# Patient Record
Sex: Male | Born: 1960 | Race: White | Hispanic: No | Marital: Married | Smoking: Never smoker
Health system: Southern US, Community
[De-identification: ages and names within clinical notes are randomized; demographics above are authoritative.]

## PROBLEM LIST (undated history)

## (undated) DIAGNOSIS — R001 Bradycardia, unspecified: Secondary | ICD-10-CM

## (undated) DIAGNOSIS — E039 Hypothyroidism, unspecified: Secondary | ICD-10-CM

## (undated) DIAGNOSIS — E785 Hyperlipidemia, unspecified: Secondary | ICD-10-CM

## (undated) HISTORY — PX: CHOLECYSTECTOMY: SHX55

---

## 1998-07-12 ENCOUNTER — Ambulatory Visit (HOSPITAL_BASED_OUTPATIENT_CLINIC_OR_DEPARTMENT_OTHER): Admission: RE | Admit: 1998-07-12 | Discharge: 1998-07-12 | Payer: Self-pay | Admitting: Orthopedic Surgery

## 2001-07-19 ENCOUNTER — Encounter: Payer: Self-pay | Admitting: Emergency Medicine

## 2001-07-19 ENCOUNTER — Emergency Department (HOSPITAL_COMMUNITY): Admission: EM | Admit: 2001-07-19 | Discharge: 2001-07-19 | Payer: Self-pay | Admitting: Emergency Medicine

## 2001-07-29 ENCOUNTER — Encounter: Payer: Self-pay | Admitting: *Deleted

## 2001-07-29 ENCOUNTER — Observation Stay (HOSPITAL_COMMUNITY): Admission: RE | Admit: 2001-07-29 | Discharge: 2001-07-30 | Payer: Self-pay | Admitting: *Deleted

## 2002-08-16 ENCOUNTER — Encounter: Admission: RE | Admit: 2002-08-16 | Discharge: 2002-08-16 | Payer: Self-pay | Admitting: *Deleted

## 2002-08-16 ENCOUNTER — Encounter: Payer: Self-pay | Admitting: *Deleted

## 2004-06-05 ENCOUNTER — Encounter: Admission: RE | Admit: 2004-06-05 | Discharge: 2004-06-05 | Payer: Self-pay | Admitting: General Surgery

## 2006-09-02 ENCOUNTER — Encounter: Admission: RE | Admit: 2006-09-02 | Discharge: 2006-09-02 | Payer: Self-pay | Admitting: Internal Medicine

## 2015-04-05 ENCOUNTER — Other Ambulatory Visit: Payer: Self-pay | Admitting: Gastroenterology

## 2015-06-12 ENCOUNTER — Encounter (HOSPITAL_COMMUNITY): Payer: Self-pay | Admitting: *Deleted

## 2015-06-12 NOTE — Anesthesia Preprocedure Evaluation (Addendum)
Anesthesia Evaluation  Patient identified by MRN, date of birth, ID band Patient awake    Reviewed: Allergy & Precautions, NPO status , Patient's Chart, lab work & pertinent test results  Airway Mallampati: III   Neck ROM: Full    Dental  (+) Teeth Intact, Dental Advisory Given   Pulmonary neg pulmonary ROS,    breath sounds clear to auscultation       Cardiovascular negative cardio ROS   Rhythm:Regular     Neuro/Psych negative neurological ROS  negative psych ROS   GI/Hepatic Neg liver ROS, GERD  Medicated,  Endo/Other  negative endocrine ROSHypothyroidism   Renal/GU negative Renal ROS  negative genitourinary   Musculoskeletal negative musculoskeletal ROS (+)   Abdominal (+)  Abdomen: soft.    Peds negative pediatric ROS (+)  Hematology negative hematology ROS (+)   Anesthesia Other Findings   Reproductive/Obstetrics negative OB ROS                            Anesthesia Physical Anesthesia Plan  ASA: II  Anesthesia Plan: MAC   Post-op Pain Management:    Induction: Intravenous  Airway Management Planned: Nasal Cannula  Additional Equipment:   Intra-op Plan:   Post-operative Plan:   Informed Consent: I have reviewed the patients History and Physical, chart, labs and discussed the procedure including the risks, benefits and alternatives for the proposed anesthesia with the patient or authorized representative who has indicated his/her understanding and acceptance.     Plan Discussed with:   Anesthesia Plan Comments:         Anesthesia Quick Evaluation

## 2015-06-19 ENCOUNTER — Ambulatory Visit (HOSPITAL_COMMUNITY): Payer: BLUE CROSS/BLUE SHIELD | Admitting: Anesthesiology

## 2015-06-19 ENCOUNTER — Encounter (HOSPITAL_COMMUNITY)
Admission: RE | Disposition: A | Discharge status: Home / Self Care | Payer: Self-pay | Source: Ambulatory Visit | Attending: Gastroenterology

## 2015-06-19 ENCOUNTER — Encounter (HOSPITAL_COMMUNITY): Payer: Self-pay | Admitting: *Deleted

## 2015-06-19 ENCOUNTER — Ambulatory Visit (HOSPITAL_COMMUNITY)
Admission: RE | Admit: 2015-06-19 | Discharge: 2015-06-19 | Disposition: A | Discharge status: Home / Self Care | Payer: BLUE CROSS/BLUE SHIELD | Source: Ambulatory Visit | Attending: Gastroenterology | Admitting: Gastroenterology

## 2015-06-19 DIAGNOSIS — Z79899 Other long term (current) drug therapy: Secondary | ICD-10-CM | POA: Diagnosis not present

## 2015-06-19 DIAGNOSIS — Z1211 Encounter for screening for malignant neoplasm of colon: Secondary | ICD-10-CM | POA: Diagnosis not present

## 2015-06-19 DIAGNOSIS — Z8711 Personal history of peptic ulcer disease: Secondary | ICD-10-CM | POA: Insufficient documentation

## 2015-06-19 DIAGNOSIS — E039 Hypothyroidism, unspecified: Secondary | ICD-10-CM | POA: Diagnosis not present

## 2015-06-19 DIAGNOSIS — K219 Gastro-esophageal reflux disease without esophagitis: Secondary | ICD-10-CM | POA: Diagnosis not present

## 2015-06-19 DIAGNOSIS — Z9049 Acquired absence of other specified parts of digestive tract: Secondary | ICD-10-CM | POA: Diagnosis not present

## 2015-06-19 HISTORY — PX: COLONOSCOPY WITH PROPOFOL: SHX5780

## 2015-06-19 HISTORY — DX: Hypothyroidism, unspecified: E03.9

## 2015-06-19 HISTORY — DX: Hyperlipidemia, unspecified: E78.5

## 2015-06-19 HISTORY — DX: Bradycardia, unspecified: R00.1

## 2015-06-19 SURGERY — COLONOSCOPY WITH PROPOFOL
Anesthesia: Monitor Anesthesia Care

## 2015-06-19 MED ORDER — SODIUM CHLORIDE 0.9 % IV SOLN
INTRAVENOUS | Status: DC
Start: 2015-06-19 — End: 2015-06-19

## 2015-06-19 MED ORDER — LIDOCAINE HCL (CARDIAC) 20 MG/ML IV SOLN
INTRAVENOUS | Status: AC
  Filled 2015-06-19: qty 5

## 2015-06-19 MED ORDER — PROPOFOL 10 MG/ML IV BOLUS
INTRAVENOUS | Status: AC
  Filled 2015-06-19: qty 40

## 2015-06-19 MED ORDER — MEPERIDINE HCL 100 MG/ML IJ SOLN: 6.2500 mg | INTRAMUSCULAR | Status: DC | PRN

## 2015-06-19 MED ORDER — PROMETHAZINE HCL 25 MG/ML IJ SOLN: 6.2500 mg | INTRAMUSCULAR | Status: DC | PRN

## 2015-06-19 MED ORDER — FENTANYL CITRATE (PF) 100 MCG/2ML IJ SOLN: 25.0000 ug | INTRAMUSCULAR | Status: DC | PRN

## 2015-06-19 MED ORDER — PROPOFOL 10 MG/ML IV BOLUS
INTRAVENOUS | Status: DC | PRN
Start: 2015-06-19 — End: 2015-06-19
  Administered 2015-06-19 (×3): 100 mg via INTRAVENOUS
  Administered 2015-06-19: 50 mg via INTRAVENOUS

## 2015-06-19 MED ORDER — LACTATED RINGERS IV SOLN
INTRAVENOUS | Status: DC
  Administered 2015-06-19: 09:00:00 via INTRAVENOUS

## 2015-06-19 SURGICAL SUPPLY — 21 items

## 2015-06-19 NOTE — Anesthesia Postprocedure Evaluation (Signed)
Anesthesia Post Note  Patient: Keith Chen  Procedure(s) Performed: Procedure(s) (LRB): COLONOSCOPY WITH PROPOFOL (N/A)  Patient location during evaluation: PACU Anesthesia Type: MAC Level of consciousness: awake and alert Pain management: pain level controlled Vital Signs Assessment: post-procedure vital signs reviewed and stable Respiratory status: spontaneous breathing, nonlabored ventilation, respiratory function stable and patient connected to nasal cannula oxygen Cardiovascular status: stable and blood pressure returned to baseline Anesthetic complications: no    Last Vitals:  Filed Vitals:   06/19/15 1000 06/19/15 1010  BP: 133/76 121/78  Pulse: 56 53  Temp:    Resp: 18 15    Last Pain: There were no vitals filed for this visit.               Geneviene Tesch

## 2015-06-19 NOTE — Op Note (Signed)
Procedure: Baseline screening colonoscopy.  Endoscopist: Danise Edge  Premedication: Propofol administered by anesthesia  Procedure: The patient was placed in the left lateral decubitus position. Anal inspection and digital rectal exam were normal. The Pentax pediatric colonoscope was introduced into the rectum and advanced to the cecum. A normal-appearing appendiceal orifice and ileocecal valve were identified. Colonic preparation for the exam today was good. Withdrawal time was 13 minutes  Rectum. Normal. Retroflexed view of the distal rectum was normal  Sigmoid colon and descending colon. Normal  Splenic flexure. Normal  Transverse colon. Normal  Hepatic flexure. Normal  Ascending colon. Normal  Cecum and ileocecal valve. Normal  Assessment: Normal screening colonoscopy  Recommendation: Schedule repeat screening colonoscopy in 10 years

## 2015-06-19 NOTE — Discharge Instructions (Signed)

## 2015-06-19 NOTE — Transfer of Care (Signed)
Immediate Anesthesia Transfer of Care Note  Patient: Keith Chen  Procedure(s) Performed: Procedure(s): COLONOSCOPY WITH PROPOFOL (N/A)  Patient Location: PACU  Anesthesia Type:MAC  Level of Consciousness:  sedated, patient cooperative and responds to stimulation  Airway & Oxygen Therapy:Patient Spontanous Breathing   Post-op Assessment:  Report given to PACU RN and Post -op Vital signs reviewed and stable  Post vital signs:  Reviewed and stable  Last Vitals:  Filed Vitals:   06/19/15 0826  BP: 136/78  Pulse: 60  Temp: 36.5 C  Resp: 15    Complications: No apparent anesthesia complications

## 2015-06-19 NOTE — H&P (Signed)
  Procedure: Baseline screening colonoscopy  History: The patient is a 55 year old male born 1961/05/13. He is scheduled to undergo his first screening colonoscopy with polypectomy to prevent colon cancer.  Past medical history: Cholecystectomy. Right elbow surgery. Peptic ulcer disease.  Allergies: Cialis caused headaches. Tramadol caused insomnia.  Family history: Negative for colon cancer.  Exam: The patient is alert and lying comfortably on the endoscopy stretcher. Abdomen is soft and nontender to palpation. Cardiac exam reveals a regular rhythm. Lungs are clear to auscultation.  Plan: Proceed with screening colonoscopy

## 2015-06-20 ENCOUNTER — Encounter (HOSPITAL_COMMUNITY): Payer: Self-pay | Admitting: Gastroenterology

## 2015-08-30 DIAGNOSIS — E039 Hypothyroidism, unspecified: Secondary | ICD-10-CM | POA: Diagnosis not present

## 2015-08-30 DIAGNOSIS — E291 Testicular hypofunction: Secondary | ICD-10-CM | POA: Diagnosis not present

## 2015-08-30 DIAGNOSIS — E559 Vitamin D deficiency, unspecified: Secondary | ICD-10-CM | POA: Diagnosis not present

## 2015-08-30 DIAGNOSIS — Z79899 Other long term (current) drug therapy: Secondary | ICD-10-CM | POA: Diagnosis not present

## 2015-08-30 DIAGNOSIS — N529 Male erectile dysfunction, unspecified: Secondary | ICD-10-CM | POA: Diagnosis not present

## 2015-08-30 DIAGNOSIS — E785 Hyperlipidemia, unspecified: Secondary | ICD-10-CM | POA: Diagnosis not present

## 2015-08-30 DIAGNOSIS — E669 Obesity, unspecified: Secondary | ICD-10-CM | POA: Diagnosis not present

## 2015-08-30 DIAGNOSIS — M25561 Pain in right knee: Secondary | ICD-10-CM | POA: Diagnosis not present

## 2016-02-29 DIAGNOSIS — E291 Testicular hypofunction: Secondary | ICD-10-CM | POA: Diagnosis not present

## 2016-02-29 DIAGNOSIS — E039 Hypothyroidism, unspecified: Secondary | ICD-10-CM | POA: Diagnosis not present

## 2016-02-29 DIAGNOSIS — M7580 Other shoulder lesions, unspecified shoulder: Secondary | ICD-10-CM | POA: Diagnosis not present

## 2016-02-29 DIAGNOSIS — E559 Vitamin D deficiency, unspecified: Secondary | ICD-10-CM | POA: Diagnosis not present

## 2016-02-29 DIAGNOSIS — Z79899 Other long term (current) drug therapy: Secondary | ICD-10-CM | POA: Diagnosis not present

## 2016-02-29 DIAGNOSIS — E785 Hyperlipidemia, unspecified: Secondary | ICD-10-CM | POA: Diagnosis not present

## 2016-02-29 DIAGNOSIS — Z0000 Encounter for general adult medical examination without abnormal findings: Secondary | ICD-10-CM | POA: Diagnosis not present

## 2016-02-29 DIAGNOSIS — N529 Male erectile dysfunction, unspecified: Secondary | ICD-10-CM | POA: Diagnosis not present

## 2016-05-09 DIAGNOSIS — E039 Hypothyroidism, unspecified: Secondary | ICD-10-CM | POA: Diagnosis not present

## 2016-06-04 DIAGNOSIS — H04123 Dry eye syndrome of bilateral lacrimal glands: Secondary | ICD-10-CM | POA: Diagnosis not present

## 2016-06-04 DIAGNOSIS — H40033 Anatomical narrow angle, bilateral: Secondary | ICD-10-CM | POA: Diagnosis not present

## 2016-09-10 DIAGNOSIS — M7580 Other shoulder lesions, unspecified shoulder: Secondary | ICD-10-CM | POA: Diagnosis not present

## 2016-09-10 DIAGNOSIS — E669 Obesity, unspecified: Secondary | ICD-10-CM | POA: Diagnosis not present

## 2016-09-10 DIAGNOSIS — E559 Vitamin D deficiency, unspecified: Secondary | ICD-10-CM | POA: Diagnosis not present

## 2016-09-10 DIAGNOSIS — M25561 Pain in right knee: Secondary | ICD-10-CM | POA: Diagnosis not present

## 2016-09-10 DIAGNOSIS — N529 Male erectile dysfunction, unspecified: Secondary | ICD-10-CM | POA: Diagnosis not present

## 2016-09-10 DIAGNOSIS — E785 Hyperlipidemia, unspecified: Secondary | ICD-10-CM | POA: Diagnosis not present

## 2016-09-10 DIAGNOSIS — E291 Testicular hypofunction: Secondary | ICD-10-CM | POA: Diagnosis not present

## 2016-09-10 DIAGNOSIS — E039 Hypothyroidism, unspecified: Secondary | ICD-10-CM | POA: Diagnosis not present

## 2017-03-10 ENCOUNTER — Other Ambulatory Visit: Payer: Self-pay | Admitting: Internal Medicine

## 2017-03-10 ENCOUNTER — Ambulatory Visit
Admission: RE | Admit: 2017-03-10 | Discharge: 2017-03-10 | Disposition: A | Discharge status: Home / Self Care | Payer: BLUE CROSS/BLUE SHIELD | Source: Ambulatory Visit | Attending: Internal Medicine | Admitting: Internal Medicine

## 2017-03-10 DIAGNOSIS — N529 Male erectile dysfunction, unspecified: Secondary | ICD-10-CM | POA: Diagnosis not present

## 2017-03-10 DIAGNOSIS — E291 Testicular hypofunction: Secondary | ICD-10-CM | POA: Diagnosis not present

## 2017-03-10 DIAGNOSIS — M542 Cervicalgia: Secondary | ICD-10-CM | POA: Diagnosis not present

## 2017-03-10 DIAGNOSIS — M5412 Radiculopathy, cervical region: Secondary | ICD-10-CM

## 2017-03-10 DIAGNOSIS — Z79899 Other long term (current) drug therapy: Secondary | ICD-10-CM | POA: Diagnosis not present

## 2017-03-10 DIAGNOSIS — E559 Vitamin D deficiency, unspecified: Secondary | ICD-10-CM | POA: Diagnosis not present

## 2017-03-10 DIAGNOSIS — Z0000 Encounter for general adult medical examination without abnormal findings: Secondary | ICD-10-CM | POA: Diagnosis not present

## 2017-03-10 DIAGNOSIS — E785 Hyperlipidemia, unspecified: Secondary | ICD-10-CM | POA: Diagnosis not present

## 2017-03-10 DIAGNOSIS — E039 Hypothyroidism, unspecified: Secondary | ICD-10-CM | POA: Diagnosis not present

## 2017-03-31 ENCOUNTER — Ambulatory Visit (INDEPENDENT_AMBULATORY_CARE_PROVIDER_SITE_OTHER): Payer: BLUE CROSS/BLUE SHIELD

## 2017-03-31 ENCOUNTER — Ambulatory Visit (INDEPENDENT_AMBULATORY_CARE_PROVIDER_SITE_OTHER): Payer: BLUE CROSS/BLUE SHIELD | Admitting: Orthopaedic Surgery

## 2017-03-31 ENCOUNTER — Encounter (INDEPENDENT_AMBULATORY_CARE_PROVIDER_SITE_OTHER): Payer: Self-pay | Admitting: Orthopaedic Surgery

## 2017-03-31 DIAGNOSIS — M25521 Pain in right elbow: Secondary | ICD-10-CM

## 2017-03-31 DIAGNOSIS — M25561 Pain in right knee: Secondary | ICD-10-CM

## 2017-03-31 DIAGNOSIS — G8929 Other chronic pain: Secondary | ICD-10-CM | POA: Diagnosis not present

## 2017-03-31 DIAGNOSIS — M542 Cervicalgia: Secondary | ICD-10-CM | POA: Diagnosis not present

## 2017-03-31 DIAGNOSIS — M7541 Impingement syndrome of right shoulder: Secondary | ICD-10-CM

## 2017-03-31 MED ORDER — METHYLPREDNISOLONE ACETATE 40 MG/ML IJ SUSP
40.0000 mg | INTRAMUSCULAR | Status: AC | PRN
  Administered 2017-03-31: 40 mg via INTRA_ARTICULAR

## 2017-03-31 MED ORDER — LIDOCAINE HCL 1 % IJ SOLN
1.0000 mL | INTRAMUSCULAR | Status: AC | PRN
  Administered 2017-03-31: 1 mL

## 2017-03-31 MED ORDER — LIDOCAINE HCL 1 % IJ SOLN
3.0000 mL | INTRAMUSCULAR | Status: AC | PRN
  Administered 2017-03-31: 3 mL

## 2017-03-31 MED ORDER — NABUMETONE 500 MG PO TABS: 500.0000 mg | ORAL_TABLET | Freq: Two times a day (BID) | ORAL | 2 refills | Status: AC | PRN

## 2017-03-31 NOTE — Progress Notes (Signed)
Office Visit Note   Patient: Keith Chen           Date of Birth: November 21, 1960           MRN: [id number] Visit Date: 03/31/2017              Requested by: Marden NobleGates, Robert, MD [address] [place], [place] [address] PCP: Marden NobleGates, Robert, MD   Assessment & Plan: Visit Diagnoses:  1. Neck pain   2. Pain in right elbow   3. Impingement syndrome of right shoulder     Plan: I am going to start him on Relafen as an anti-inflammatory.  He is not a diabetic so offered him injections in his right shoulder his right knee and his right elbow.  He understands the risk and benefits of injections as well.  He has had these in the past but is been a long period of time.  I would also like to set him up for nerve conduction studies by Dr. Alvester MorinNewton for the right upper extremity to assess for cervical radiculopathy versus carpal tunnel syndrome.  He understands this as well.  I will see him back myself in 3 weeks.  When I see him in 3 weeks we do need to get an AP and lateral of his right knee due to some symptoms he let me know about as he was leaving.  Follow-Up Instructions: Return in about 3 weeks (around 04/21/2017).   Orders:  Orders Placed This Encounter  Procedures  . Large Joint Inj  . Medium Joint Inj  . Large Joint Inj  . XR Elbow 2 Views Right  . XR Cervical Spine 2 or 3 views   Meds ordered this encounter  Medications  . nabumetone (RELAFEN) 500 MG tablet    Sig: Take 1 tablet (500 mg total) 2 (two) times daily as needed by mouth.    Dispense:  60 tablet    Refill:  2      Procedures: Large Joint Inj: R knee on 03/31/2017 11:40 AM Indications: pain and joint swelling Medications: 3 mL lidocaine 1 %; 40 mg methylPREDNISolone acetate 40 MG/ML  Medium Joint Inj: R elbow on 03/31/2017 11:40 AM Indications: pain Medications: 1 mL lidocaine 1 %; 40 mg methylPREDNISolone acetate 40 MG/ML  Large Joint Inj: R subacromial bursa on 03/31/2017 11:41 AM Medications: 3 mL  lidocaine 1 %; 40 mg methylPREDNISolone acetate 40 MG/ML      Clinical Data: No additional findings.    Subjective: No chief complaint on file. 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  He comes in with chief complaint of right shoulder pain right elbow pain occasionally hand pain with numbness he gets in his hand.  He denies any significant weakness in his right upper extremity but does report some neck pain on occasion.  He has had a steroid injection in the past and his right elbow and right shoulder that has been remote.  He said that he still helped quite a bit.  He said there is no lightheaded and tunnel in terms of his need to stop performing heavy manual labor because of his job he will have to be doing for years to come.  He is right-hand dominant.  He denies any headache, chest pain, shortness of breath, fever, chills, nausea, vomiting.  HPI  Review of Systems   Objective: Vital Signs: There were no vitals  taken for this visit.  Physical Exam He is alert and oriented x3 and in no acute distress Ortho Exam Examination of his right shoulder shows an intact rotator cuff.  He has pain with internal rotation with adduction.  He has a positive Neer and Hawkins sign.  His right elbow lacks full extension by a few degrees but has full range of motion in terms of flexion he feels ligaments are stable.  He can feel some crepitation of the elbow joint itself.  He has some global tenderness and slight swelling with the elbow.  Examination of his hand shows normal grip strength.  He has some pain in the middle finger.  He has subjective numbness in the median nerve distribution but he said this only occurs though when he is gripping the steering wheel when he drives.  His Phalen's and Tinel's exams are equivocal. Specialty Comments:  No specialty comments available.  Imaging: Xr Cervical Spine 2 Or 3 Views  Result  Date: 03/31/2017 2 views of the cervical spine showed no acute findings and no malalignment.  Is minimal arthritic changes.  Xr Elbow 2 Views Right  Result Date: 03/31/2017 2 views of the right elbow show significant arthritis throughout the elbow joint.  There is no acute changes otherwise.    PMFS History: Patient Active Problem List   Diagnosis Date Noted  . Neck pain 03/31/2017  . Pain in right elbow 03/31/2017  . Impingement syndrome of right shoulder 03/31/2017   Past Medical History:  Diagnosis Date  . Bradycardia    history of   . Hyperlipidemia   . Hypothyroidism     History reviewed. No pertinent family history.  Past Surgical History:  Procedure Laterality Date  . CHOLECYSTECTOMY     laparoscopic '01   Social History   Occupational History  . Not on file  Tobacco Use  . Smoking status: Never Smoker  Substance and Sexual Activity  . Alcohol use: Yes    Comment: rare social  . Drug use: No  . Sexual activity: Not on file

## 2017-04-01 ENCOUNTER — Other Ambulatory Visit (INDEPENDENT_AMBULATORY_CARE_PROVIDER_SITE_OTHER): Payer: Self-pay

## 2017-04-01 DIAGNOSIS — M79601 Pain in right arm: Secondary | ICD-10-CM

## 2017-04-22 ENCOUNTER — Encounter (INDEPENDENT_AMBULATORY_CARE_PROVIDER_SITE_OTHER): Payer: BLUE CROSS/BLUE SHIELD | Admitting: Physical Medicine and Rehabilitation

## 2017-04-28 ENCOUNTER — Ambulatory Visit (INDEPENDENT_AMBULATORY_CARE_PROVIDER_SITE_OTHER): Payer: BLUE CROSS/BLUE SHIELD | Admitting: Orthopaedic Surgery

## 2017-04-28 ENCOUNTER — Ambulatory Visit (INDEPENDENT_AMBULATORY_CARE_PROVIDER_SITE_OTHER): Payer: BLUE CROSS/BLUE SHIELD

## 2017-04-28 ENCOUNTER — Encounter (INDEPENDENT_AMBULATORY_CARE_PROVIDER_SITE_OTHER): Payer: Self-pay | Admitting: Orthopaedic Surgery

## 2017-04-28 DIAGNOSIS — G8929 Other chronic pain: Secondary | ICD-10-CM | POA: Diagnosis not present

## 2017-04-28 DIAGNOSIS — M25561 Pain in right knee: Secondary | ICD-10-CM | POA: Diagnosis not present

## 2017-04-28 NOTE — Progress Notes (Signed)
Consult Note   Patient: Keith Chen             Date of Birth: November 28, 1960           MRN: [id number]             Visit Date: 04/28/2017  Requested by: Marden NobleGates, Robert, MD [address] [place], [place] [address] PCP: Marden NobleGates, Robert, MD  Thank you for asking me to evaluate this patient for No chief complaint on file.  Below are my findings.     Assessment & Plan: Visit Diagnoses:  1. Chronic pain of right knee     Plan: At this point he does not have time for physical therapy which I think would help on his neck.  We will watch his knee for now since the steroid injection in the Relafen does help.  Were also can hold off on nerve conduction studies since his numbness and tingling has improved dramatically on the right upper extremity.  All questions and concerns were answered and addressed.  My next step for his knee would be an MRI or at least repeat injection if he continues to have mechanical symptoms.  He will otherwise follow-up as needed.  Follow Up Instructions: Return if symptoms worsen or fail to improve.  Orders: Orders Placed This Encounter  Procedures  . XR Knee 1-2 Views Right   No orders of the defined types were placed in this encounter.    Procedures: No procedures performed   Clinical Data: No additional findings.   Subjective:  No chief complaint on file. The patient is coming today with mainly a chief complaint of right knee pain but were also following him for right-sided neck pain and right shoulder and elbow pain.  We at least provided 3 different injections a month ago with one being in the right shoulder subacromial space and one in the right elbow.  We also provided 1 in the right knee.  He says the knee mainly hurts with pivoting types of activities.  He sustained an injury to that knee about 5 years ago and he said it swelled up for about 3 or 4 days.  Again he reiterates that the injection has helped.  Right now he is denying any  locking catching.  He says his numbness and tingling going down his right arm is also almost resolved.  We had set him up for nerve conduction studies but he had a missed that appointment.  HPI  Review of Systems He currently denies any headache, chest pain, shortness of breath, fever, chills, nausea, vomiting.  Objective: Vital Signs: There were no vitals taken for this visit.  Physical Exam  Is alert and oriented x3 and in no acute distress Ortho Exam  Examination of his neck does show stiffness in the paraspinal muscles to the right side into the trapezius muscle as well.  Examination of his right shoulder and elbow have improved in terms of mobility.  He is got excellent grip strength on his right side.  His right knee is assessed and found to have a negative McMurray's negative Lockman's exam.  There is minimal tenderness in his knee with good range of motion.  The knee is ligamentously stable. Specialty Comments:  No specialty comments available.  Imaging:  Xr Knee 1-2 Views Right  Result Date: 04/28/2017 2 views of the right knee show no acute findings.  The joint space is still well-maintained and there is minimal patellofemoral arthritic changes.  There is no effusion.    PMFS History: Patient Active Problem List   Diagnosis Date Noted  . Chronic pain of right knee 04/28/2017  . Neck pain 03/31/2017  . Pain in right elbow 03/31/2017  . Impingement syndrome of right shoulder 03/31/2017   Past Medical History:  Diagnosis Date  . Bradycardia    history of   . Hyperlipidemia   . Hypothyroidism     History reviewed. No pertinent family history. Past Surgical History:  Procedure Laterality Date  . CHOLECYSTECTOMY     laparoscopic '01  . COLONOSCOPY WITH PROPOFOL N/A 06/19/2015   Procedure: COLONOSCOPY WITH PROPOFOL;  Surgeon: Charolett BumpersMartin K Johnson, MD;  Location: [facility];  Service: Endoscopy;  Laterality: N/A;   Social History   Occupational History  . Not on file    Tobacco Use  . Smoking status: Never Smoker  Substance and Sexual Activity  . Alcohol use: Yes    Comment: rare social  . Drug use: No  . Sexual activity: Not on file

## 2017-09-09 DIAGNOSIS — N529 Male erectile dysfunction, unspecified: Secondary | ICD-10-CM | POA: Diagnosis not present

## 2017-09-09 DIAGNOSIS — Z79899 Other long term (current) drug therapy: Secondary | ICD-10-CM | POA: Diagnosis not present

## 2017-09-09 DIAGNOSIS — E039 Hypothyroidism, unspecified: Secondary | ICD-10-CM | POA: Diagnosis not present

## 2017-09-09 DIAGNOSIS — E785 Hyperlipidemia, unspecified: Secondary | ICD-10-CM | POA: Diagnosis not present

## 2017-09-09 DIAGNOSIS — E291 Testicular hypofunction: Secondary | ICD-10-CM | POA: Diagnosis not present

## 2018-01-14 DIAGNOSIS — T675XXA Heat exhaustion, unspecified, initial encounter: Secondary | ICD-10-CM | POA: Diagnosis not present

## 2018-01-14 DIAGNOSIS — B009 Herpesviral infection, unspecified: Secondary | ICD-10-CM | POA: Diagnosis not present

## 2018-03-04 IMAGING — CR DG CERVICAL SPINE 2 OR 3 VIEWS
5 series · 5 of 5 positions shown · non-contrast
Comparison: None.

CLINICAL DATA: Right-sided neck pain with radiation for 2 or 3
years. Increasing pain and numbness. No acute injury or prior
relevant surgery.

EXAM:
CERVICAL SPINE - 2-3 VIEW

[w c-spine lat * (1 of 2)]
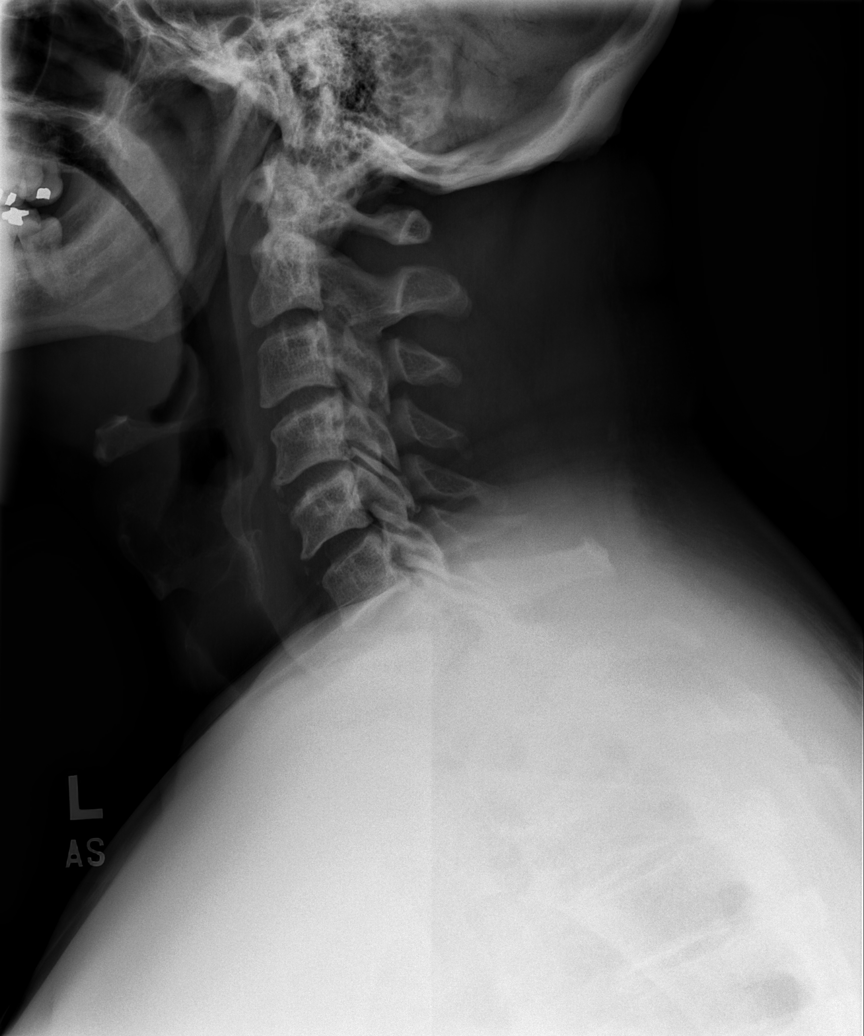

[w swimmers view *]
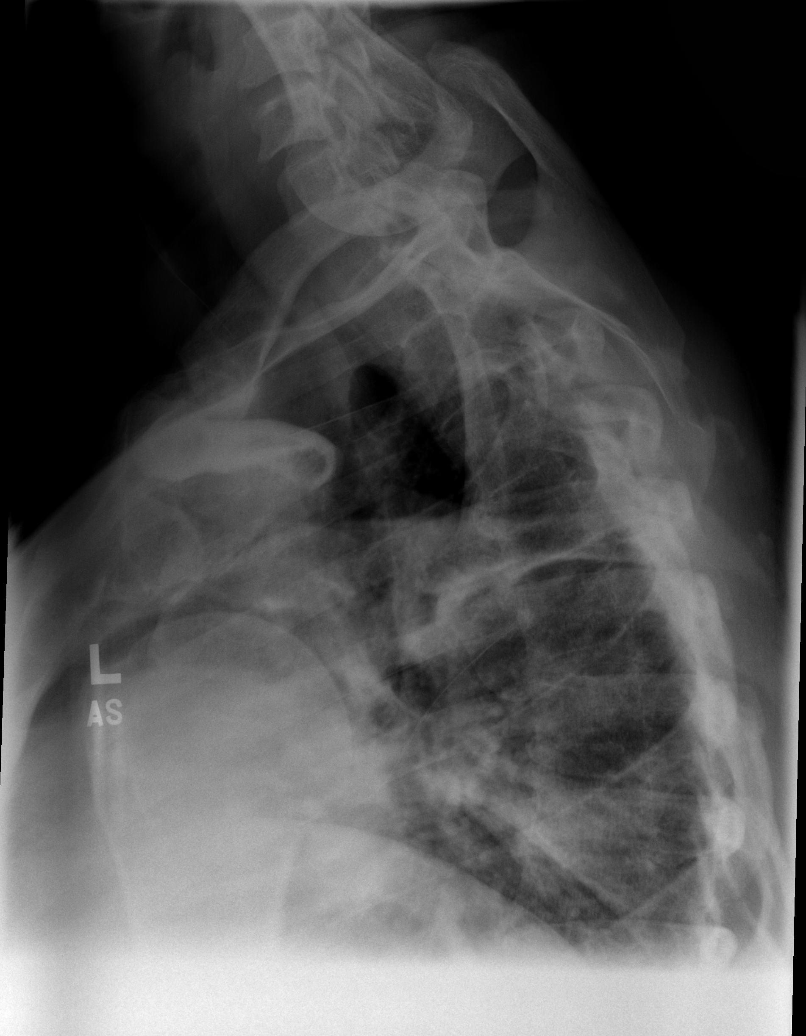

[w c-spine lat * (2 of 2)]
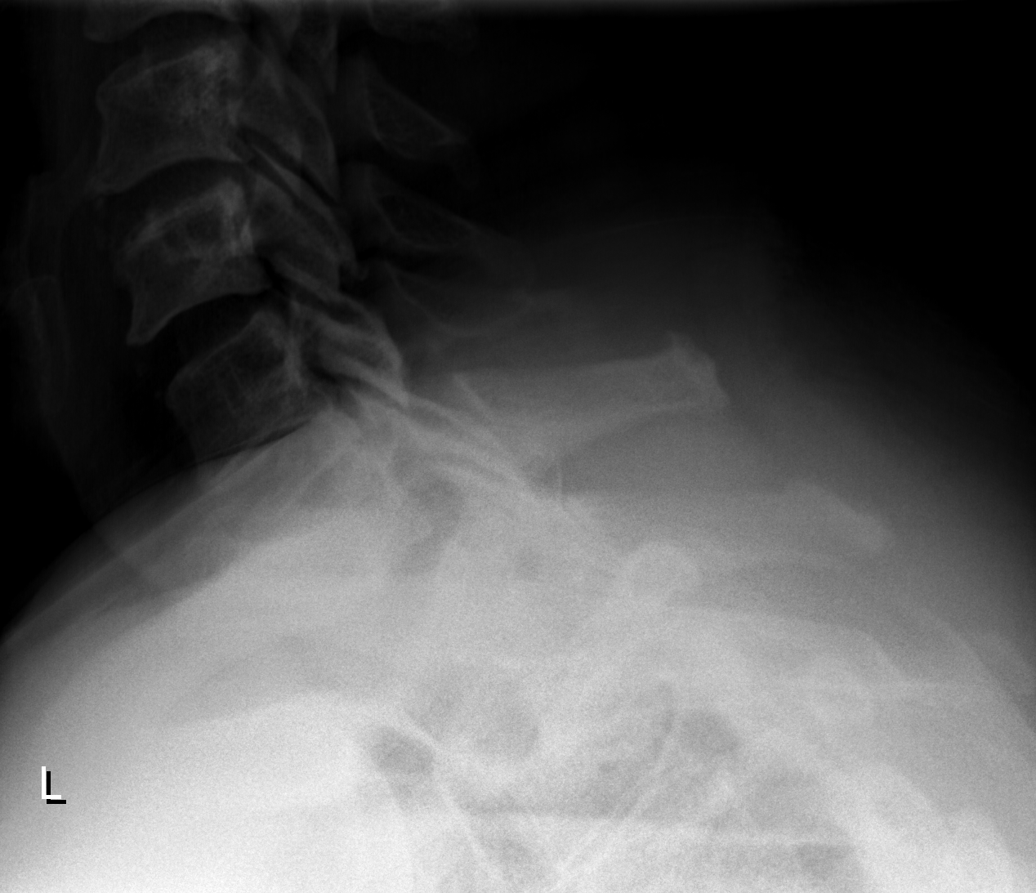

[w c-spine a.p. *]
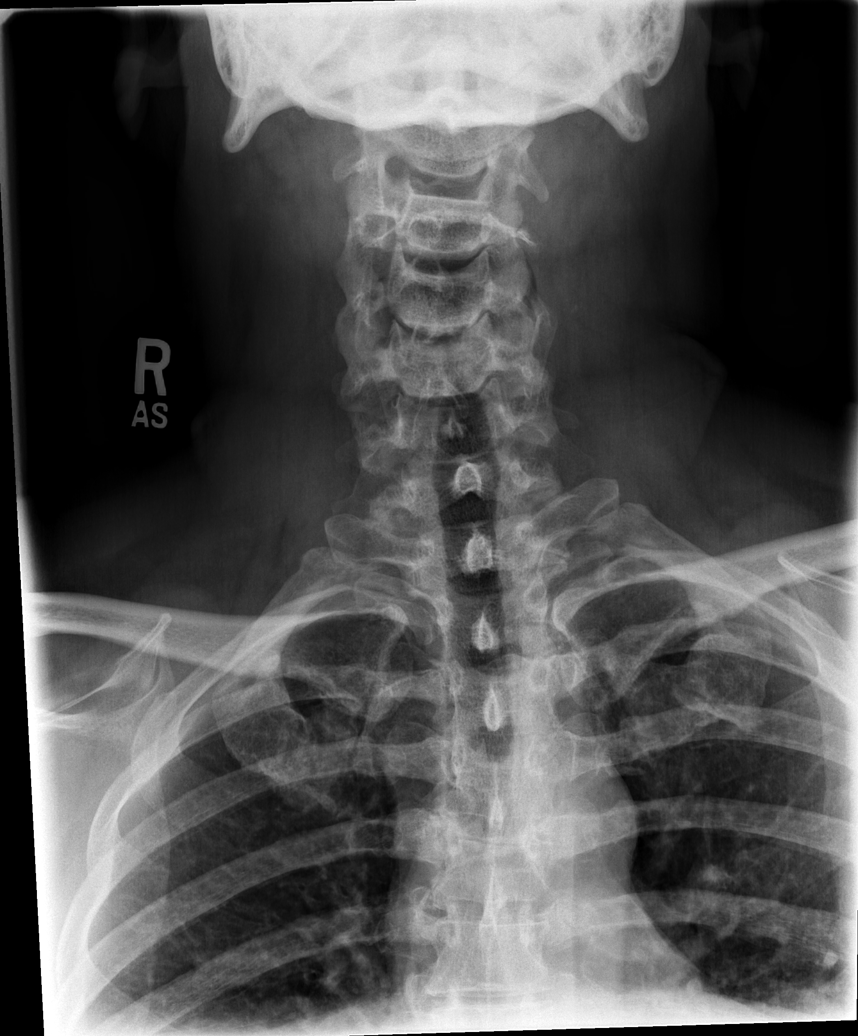

[w c-spine odontoid *]
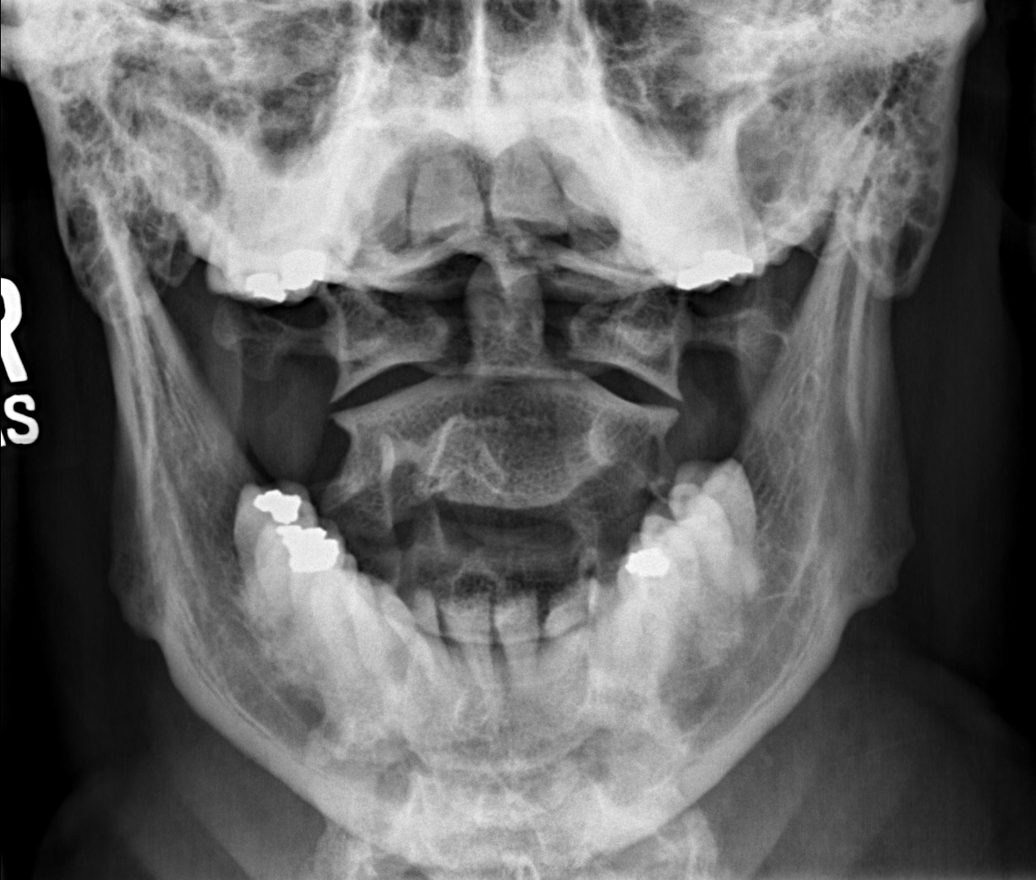

[5 of 5 positions shown; findings below may reference images not displayed]

FINDINGS: The prevertebral soft tissues are normal. The alignment is anatomic
through T1 aside from a minimal convex right scoliosis. There is no
evidence of acute fracture or traumatic subluxation. The C1-2
articulation appears normal in the AP projection. There is mild
intervertebral spurring at C4-5. No oblique views were obtained.
IMPRESSION: Minimal spondylosis at C4-5. No acute osseous findings demonstrated.

## 2018-03-19 DIAGNOSIS — E559 Vitamin D deficiency, unspecified: Secondary | ICD-10-CM | POA: Diagnosis not present

## 2018-03-19 DIAGNOSIS — E291 Testicular hypofunction: Secondary | ICD-10-CM | POA: Diagnosis not present

## 2018-03-19 DIAGNOSIS — Z125 Encounter for screening for malignant neoplasm of prostate: Secondary | ICD-10-CM | POA: Diagnosis not present

## 2018-03-19 DIAGNOSIS — N529 Male erectile dysfunction, unspecified: Secondary | ICD-10-CM | POA: Diagnosis not present

## 2018-03-19 DIAGNOSIS — Z0001 Encounter for general adult medical examination with abnormal findings: Secondary | ICD-10-CM | POA: Diagnosis not present

## 2018-03-19 DIAGNOSIS — E785 Hyperlipidemia, unspecified: Secondary | ICD-10-CM | POA: Diagnosis not present

## 2018-03-19 DIAGNOSIS — E039 Hypothyroidism, unspecified: Secondary | ICD-10-CM | POA: Diagnosis not present

## 2019-01-06 ENCOUNTER — Ambulatory Visit: Payer: Self-pay

## 2019-01-06 ENCOUNTER — Ambulatory Visit (INDEPENDENT_AMBULATORY_CARE_PROVIDER_SITE_OTHER): Payer: BC Managed Care – PPO | Admitting: Orthopaedic Surgery

## 2019-01-06 ENCOUNTER — Encounter: Payer: Self-pay | Admitting: Orthopaedic Surgery

## 2019-01-06 DIAGNOSIS — M25561 Pain in right knee: Secondary | ICD-10-CM

## 2019-01-06 DIAGNOSIS — G8929 Other chronic pain: Secondary | ICD-10-CM

## 2019-01-06 DIAGNOSIS — M5440 Lumbago with sciatica, unspecified side: Secondary | ICD-10-CM | POA: Diagnosis not present

## 2019-01-06 MED ORDER — CYCLOBENZAPRINE HCL 10 MG PO TABS: 10.0000 mg | ORAL_TABLET | Freq: Every day | ORAL | 0 refills | Status: AC

## 2019-01-06 MED ORDER — METHYLPREDNISOLONE ACETATE 40 MG/ML IJ SUSP
40.0000 mg | INTRAMUSCULAR | Status: AC | PRN
Start: 2019-01-06 — End: 2019-01-06
  Administered 2019-01-06: 40 mg via INTRA_ARTICULAR

## 2019-01-06 MED ORDER — LIDOCAINE HCL 1 % IJ SOLN
3.0000 mL | INTRAMUSCULAR | Status: AC | PRN
Start: 2019-01-06 — End: 2019-01-06
  Administered 2019-01-06: 3 mL

## 2019-01-06 MED ORDER — METHYLPREDNISOLONE 4 MG PO TABS
ORAL_TABLET | ORAL | 0 refills | Status: DC
Start: 2019-01-06 — End: 2019-05-24

## 2019-01-06 NOTE — Progress Notes (Signed)
Office Visit Note   Patient: Keith Chen           Date of Birth: 12/27/1960           MRN: [id number] Visit Date: 01/06/2019              Requested by: Marden NobleGates, Robert, MD [address] [place],  [place] [address]  PCP: Marden NobleGates, Robert, MD   Assessment & Plan: Visit Diagnoses:  1. Acute pain of right knee   2. Chronic low back pain with sciatica, sciatica laterality unspecified, unspecified back pain laterality     Plan: He will quad strengthening exercises as shown.  He is also given exercises for his back she will perform on his own.  Placed him on a Medrol Dosepak Flexeril at night.  See him back in 2 weeks to see what type of response he has had to the above treatment and cortisone injection right knee.  Pain in the knee or the leg recommend MRI to further evaluate complaints.  Questions encouraged and answered at length.  Take no NSAIDs while on the Medrol Dosepak and this is reviewed with.  Follow-Up Instructions: Return in about 2 weeks (around 01/20/2019).   Orders:  Orders Placed This Encounter  Procedures   Large Joint Inj   XR Knee 1-2 Views Right   XR Lumbar Spine 2-3 Views   Meds ordered this encounter  Medications   methylPREDNISolone (MEDROL) 4 MG tablet    Sig: Take as directed    Dispense:  21 tablet    Refill:  0   cyclobenzaprine (FLEXERIL) 10 MG tablet    Sig: Take 1 tablet (10 mg total) by mouth at bedtime.    Dispense:  30 tablet    Refill:  0      Procedures: Large Joint Inj: R knee on 01/06/2019 9:33 AM Indications: pain Details: 22 G 1.5 in needle, anterolateral approach  Arthrogram: No  Medications: 3 mL lidocaine 1 %; 40 mg methylPREDNISolone acetate 40 MG/ML Outcome: tolerated well, no immediate complications Procedure, treatment alternatives, risks and benefits explained, specific risks discussed. Consent was given by the patient. Immediately prior to procedure a time out was called to verify the correct patient,  procedure, equipment, support staff and site/side marked as required. Patient was prepped and draped in the usual sterile fashion.       Clinical Data: No additional findings.   Subjective: Chief Complaint  Patient presents with   Right Knee - Pain    HPI Mr. Keith Chen 58 year old male who is well-known to Dr. Ranae PlumberLyman service comes in today for chronic right knee pain.  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  He has had some giving way of the knee but no other mechanical symptoms.  He notes he had swelling initially but this is gone away.  He has had no injury to the knee. Distracted also notes that he is having some low back pain with radicular-like symptoms down the right leg to the knee.  He states it is sharp pain.  He denies any acute injury to the low back.  History of epidural steroid injections lumbar spine in the past.  Has had no bowel bladder dysfunction he denies any saddle anesthesia.  Pain does awaken him.  Review of Systems Denies  fevers chills shortness of breath.  Objective: Vital Signs: There were no vitals taken for this visit.  Physical Exam Constitutional:      Appearance: He is not ill-appearing or diaphoretic.  Cardiovascular:     Pulses: Normal pulses.  Pulmonary:     Effort: Pulmonary effort is normal.  Neurological:     Mental Status: He is alert and oriented to person, place, and time.  Psychiatric:        Behavior: Behavior normal.     Ortho Exam Lumbar spine he has tenderness over the lower lumbar spine and over the right paraspinous region lumbar spine.  5 out of 5 strength throughout lower extremities against resistance negative straight leg raise bilaterally with tight hamstrings bilaterally.  Good range of motion bilateral hips without pain tenderness over the trochanteric region both hips right  greater than left.  Bilateral knees full range of motion.  No instability valgus varus stressing of either knee.  Anterior drawer is negative bilaterally McMurray's is negative bilaterally.  Right knee tenderness along medial lateral joint line no tenderness about the left knee.  Right knee Tello femoral crepitus with passive range of motion.  No effusion abnormal warmth erythema of either knee.  Specialty Comments:  No specialty comments available.  Imaging: Xr Knee 1-2 Views Right  Result Date: 01/06/2019 Right knee 2 views: No acute fracture.  Mild patellofemoral changes.  Mild narrowing medial joint line.  Otherwise no bony abnormalities.  Xr Lumbar Spine 2-3 Views  Result Date: 01/06/2019 Lumbar spine AP lateral views: Loss of lordotic curvature with no spondylolisthesis.  Disc space narrowing at L1 5 S1 anterior endplate osteophytes.  No acute fractures.    PMFS History: Patient Active Problem List   Diagnosis Date Noted   Chronic pain of right knee 04/28/2017   Neck pain 03/31/2017   Pain in right elbow 03/31/2017   Impingement syndrome of right shoulder 03/31/2017   Past Medical History:  Diagnosis Date   Bradycardia    history of    Hyperlipidemia    Hypothyroidism     History reviewed. No pertinent family history.  Past Surgical History:  Procedure Laterality Date   CHOLECYSTECTOMY     laparoscopic '01   COLONOSCOPY WITH PROPOFOL N/A 06/19/2015   Procedure: COLONOSCOPY WITH PROPOFOL;  Surgeon: Garlan Fair, MD;  Location: [facility];  Service: Endoscopy;  Laterality: N/A;   Social History   Occupational History   Not on file  Tobacco Use   Smoking status: Never Smoker  Substance and Sexual Activity   Alcohol use: Yes    Comment: rare social   Drug use: No   Sexual activity: Not on file

## 2019-01-07 ENCOUNTER — Telehealth: Payer: Self-pay | Admitting: Orthopaedic Surgery

## 2019-01-07 NOTE — Telephone Encounter (Signed)
Patient's wife Lovey Newcomer called advised Rx for Prednisone do not have instructions on the bottle. Lovey Newcomer said there are 21 tabs in the bottle.  The number to contact Lovey Newcomer is [phone]

## 2019-01-07 NOTE — Telephone Encounter (Signed)
Patient's wife left a message stating that there was no directions as far as how to take the Prednisone.  Please call her back at [phone] (W).  Patient is not able to take phone calls.  Thank you.

## 2019-01-07 NOTE — Telephone Encounter (Signed)
Called patient and left message on how to take dose pak

## 2019-01-20 ENCOUNTER — Ambulatory Visit: Payer: BC Managed Care – PPO | Admitting: Orthopaedic Surgery

## 2019-04-08 DIAGNOSIS — Z125 Encounter for screening for malignant neoplasm of prostate: Secondary | ICD-10-CM | POA: Diagnosis not present

## 2019-04-08 DIAGNOSIS — Z0001 Encounter for general adult medical examination with abnormal findings: Secondary | ICD-10-CM | POA: Diagnosis not present

## 2019-04-08 DIAGNOSIS — E669 Obesity, unspecified: Secondary | ICD-10-CM | POA: Diagnosis not present

## 2019-04-08 DIAGNOSIS — E559 Vitamin D deficiency, unspecified: Secondary | ICD-10-CM | POA: Diagnosis not present

## 2019-04-08 DIAGNOSIS — E785 Hyperlipidemia, unspecified: Secondary | ICD-10-CM | POA: Diagnosis not present

## 2019-04-08 DIAGNOSIS — I10 Essential (primary) hypertension: Secondary | ICD-10-CM | POA: Diagnosis not present

## 2019-04-08 DIAGNOSIS — E039 Hypothyroidism, unspecified: Secondary | ICD-10-CM | POA: Diagnosis not present

## 2019-04-08 DIAGNOSIS — E291 Testicular hypofunction: Secondary | ICD-10-CM | POA: Diagnosis not present

## 2019-05-24 ENCOUNTER — Ambulatory Visit: Payer: Self-pay

## 2019-05-24 ENCOUNTER — Encounter: Payer: Self-pay | Admitting: Orthopaedic Surgery

## 2019-05-24 ENCOUNTER — Ambulatory Visit: Payer: BC Managed Care – PPO | Admitting: Orthopaedic Surgery

## 2019-05-24 ENCOUNTER — Other Ambulatory Visit: Payer: Self-pay

## 2019-05-24 DIAGNOSIS — M25521 Pain in right elbow: Secondary | ICD-10-CM | POA: Diagnosis not present

## 2019-05-24 DIAGNOSIS — M79641 Pain in right hand: Secondary | ICD-10-CM

## 2019-05-24 DIAGNOSIS — M25561 Pain in right knee: Secondary | ICD-10-CM

## 2019-05-24 DIAGNOSIS — G8929 Other chronic pain: Secondary | ICD-10-CM | POA: Diagnosis not present

## 2019-05-24 MED ORDER — METHYLPREDNISOLONE ACETATE 40 MG/ML IJ SUSP
40.0000 mg | INTRAMUSCULAR | Status: AC | PRN
  Administered 2019-05-24: 40 mg via INTRA_ARTICULAR

## 2019-05-24 MED ORDER — GABAPENTIN 100 MG PO CAPS: 100.0000 mg | ORAL_CAPSULE | Freq: Three times a day (TID) | ORAL | 3 refills | Status: DC

## 2019-05-24 MED ORDER — LIDOCAINE HCL 1 % IJ SOLN
3.0000 mL | INTRAMUSCULAR | Status: AC | PRN
Start: 2019-05-24 — End: 2019-05-24
  Administered 2019-05-24: 3 mL

## 2019-05-24 MED ORDER — LIDOCAINE HCL 1 % IJ SOLN
1.0000 mL | INTRAMUSCULAR | Status: AC | PRN
Start: 2019-05-24 — End: 2019-05-24
  Administered 2019-05-24: 1 mL

## 2019-05-24 MED ORDER — METHYLPREDNISOLONE 4 MG PO TABS: ORAL_TABLET | ORAL | 0 refills | Status: AC
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
## 2019-06-21 ENCOUNTER — Ambulatory Visit: Payer: BC Managed Care – PPO | Admitting: Orthopaedic Surgery

## 2019-09-07 ENCOUNTER — Ambulatory Visit: Payer: BC Managed Care – PPO | Admitting: Orthopaedic Surgery

## 2019-09-07 ENCOUNTER — Ambulatory Visit: Payer: Self-pay

## 2019-09-07 ENCOUNTER — Other Ambulatory Visit: Payer: Self-pay

## 2019-09-07 DIAGNOSIS — M25521 Pain in right elbow: Secondary | ICD-10-CM

## 2019-09-07 DIAGNOSIS — M25561 Pain in right knee: Secondary | ICD-10-CM

## 2019-09-07 DIAGNOSIS — M25512 Pain in left shoulder: Secondary | ICD-10-CM | POA: Diagnosis not present

## 2019-09-07 DIAGNOSIS — G8929 Other chronic pain: Secondary | ICD-10-CM | POA: Diagnosis not present

## 2019-09-07 MED ORDER — LIDOCAINE HCL 1 % IJ SOLN
2.0000 mL | INTRAMUSCULAR | Status: AC | PRN
  Administered 2019-09-07: 2 mL

## 2019-09-07 MED ORDER — METHYLPREDNISOLONE ACETATE 40 MG/ML IJ SUSP
40.0000 mg | INTRAMUSCULAR | Status: AC | PRN
Start: 2019-09-07 — End: 2019-09-07
  Administered 2019-09-07: 40 mg via INTRA_ARTICULAR

## 2019-09-07 MED ORDER — METHYLPREDNISOLONE ACETATE 40 MG/ML IJ SUSP
40.0000 mg | INTRAMUSCULAR | Status: AC | PRN
  Administered 2019-09-07: 40 mg via INTRA_ARTICULAR

## 2019-09-07 MED ORDER — METHYLPREDNISOLONE ACETATE 40 MG/ML IJ SUSP
40.0000 mg | INTRAMUSCULAR | Status: AC | PRN
  Administered 2019-09-07: 09:00:00 40 mg via INTRA_ARTICULAR

## 2019-09-07 MED ORDER — LIDOCAINE HCL 1 % IJ SOLN
3.0000 mL | INTRAMUSCULAR | Status: AC | PRN
  Administered 2019-09-07: 09:00:00 3 mL

## 2019-09-07 MED ORDER — LIDOCAINE HCL 1 % IJ SOLN
3.0000 mL | INTRAMUSCULAR | Status: AC | PRN
Start: 2019-09-07 — End: 2019-09-07
  Administered 2019-09-07: 09:00:00 3 mL

## 2019-09-07 NOTE — Progress Notes (Signed)
Office Visit Note   Patient: Keith Chen           Date of Birth: Mar 09, 1961           MRN: [id number] Visit Date: 09/07/2019              Requested by: Marden Noble, MD [address] [place],  [place] [address] PCP: Marden Noble, MD   Assessment & Plan: Visit Diagnoses:  1. Chronic left shoulder pain   2. Pain in right elbow   3. Chronic pain of right knee     Plan: I did feel it is reasonable to try injections in his left shoulder subacromial space as well as his right elbow joint and his right knee joint.  He is not a diabetic and is fully aware of the risk and benefits of injections.  He has had 3 at one time before.  He is already had his COVID-19 vaccination and is been over a week.  He understands fully the risk and benefits injections and did tolerate them well in all 3 areas.  I would like [place] to see him back in a week to really examine his left shoulder again to see what response he is at from a steroid injection.  If there is still continued weakness noted I would recommend a a MRI of the left shoulder.  Follow-Up Instructions: Return in about 1 week (around 09/14/2019).   Orders:  Orders Placed This Encounter  Procedures  . Large Joint Inj  . Large Joint Inj  . Medium Joint Inj  . XR Shoulder Left   No orders of the defined types were placed in this encounter.     Procedures: Large Joint Inj: L subacromial bursa on 09/07/2019 9:11 AM Indications: pain and diagnostic evaluation Details: 22 G 1.5 in needle  Arthrogram: No  Medications: 3 mL lidocaine 1 %; 40 mg methylPREDNISolone acetate 40 MG/ML Outcome: tolerated well, no immediate complications Procedure, treatment alternatives, risks and benefits explained, specific risks discussed. Consent was given by the patient. Immediately prior to procedure a time out was called to verify the correct patient, procedure, equipment, support staff and site/side marked as required. Patient was prepped and  draped in the usual sterile fashion.   Large Joint Inj: R knee on 09/07/2019 9:11 AM Indications: diagnostic evaluation and pain Details: 22 G 1.5 in needle, superolateral approach  Arthrogram: No  Medications: 3 mL lidocaine 1 %; 40 mg methylPREDNISolone acetate 40 MG/ML Outcome: tolerated well, no immediate complications Procedure, treatment alternatives, risks and benefits explained, specific risks discussed. Consent was given by the patient. Immediately prior to procedure a time out was called to verify the correct patient, procedure, equipment, support staff and site/side marked as required. Patient was prepped and draped in the usual sterile fashion.   Medium Joint Inj: R elbow on 09/07/2019 9:11 AM Medications: 2 mL lidocaine 1 %; 40 mg methylPREDNISolone acetate 40 MG/ML      Clinical Data: No additional findings.   Subjective: Chief Complaint  Patient presents with  . Left Shoulder - Follow-up, Pain  . Right Elbow - Follow-up, Pain  The patient comes in today for evaluation treatment of left shoulder pain, right elbow pain and right knee pain.  We have seen him for his right elbow and right knee before.  He has a remote history of an arthroscopic intervention to the right elbow and known osteoarthritis of the right elbow.  From time to time he has  had right knee pain he gets an injection in his right knee with a steroid.  His left shoulder started hurting earlier this year when he was working on a generator was trying it he has had severe pain in his shoulder since then and difficulty with lifting shoulder above his head on the left side.  He is never injured this left shoulder before or had surgery on it.  He is not a diabetic.  He denies any neck pain and denies any numbness and tingling in his arms or hands.  HPI  Review of Systems He currently denies any headache, chest pain, shortness of breath, fever, chills, nausea, vomiting  Objective: Vital Signs: There were no  vitals taken for this visit.  Physical Exam He is alert and orient x3 and in no acute distress Ortho Exam Examination of his left shoulder does show significant positive Neer and Hawkins signs.  He is using more of his deltoid abduct her shoulder as well.  He is able to reach behind and well and has a negative liftoff.  There is some slight weakness in the rotator cuff on exam.  Examination of his left elbow is normal exam shows right elbow shows a lacks full extension by few degrees and has some grinding with flexion extension as well as pronation supination.  Examination of the right knee shows no effusion but medial joint line tenderness and slight varus malalignment.  His right knee is ligamentously stable. Specialty Comments:  No specialty comments available.  Imaging: No results found.   PMFS History: Patient Active Problem List   Diagnosis Date Noted  . Chronic pain of right knee 04/28/2017  . Neck pain 03/31/2017  . Pain in right elbow 03/31/2017  . Impingement syndrome of right shoulder 03/31/2017   Past Medical History:  Diagnosis Date  . Bradycardia    history of   . Hyperlipidemia   . Hypothyroidism     No family history on file.  Past Surgical History:  Procedure Laterality Date  . CHOLECYSTECTOMY     laparoscopic '01  . COLONOSCOPY WITH PROPOFOL N/A 06/19/2015   Procedure: COLONOSCOPY WITH PROPOFOL;  Surgeon: Garlan Fair, MD;  Location: [facility];  Service: Endoscopy;  Laterality: N/A;   Social History   Occupational History  . Not on file  Tobacco Use  . Smoking status: Never Smoker  Substance and Sexual Activity  . Alcohol use: Yes    Comment: rare social  . Drug use: No  . Sexual activity: Not on file

## 2019-09-14 ENCOUNTER — Encounter: Payer: Self-pay | Admitting: Orthopaedic Surgery

## 2019-09-14 ENCOUNTER — Other Ambulatory Visit: Payer: Self-pay

## 2019-09-14 ENCOUNTER — Ambulatory Visit: Payer: BC Managed Care – PPO | Admitting: Orthopaedic Surgery

## 2019-09-14 DIAGNOSIS — G8929 Other chronic pain: Secondary | ICD-10-CM

## 2019-09-14 DIAGNOSIS — M25512 Pain in left shoulder: Secondary | ICD-10-CM | POA: Diagnosis not present

## 2019-09-14 DIAGNOSIS — Z96612 Presence of left artificial shoulder joint: Secondary | ICD-10-CM

## 2019-09-14 NOTE — Progress Notes (Signed)
The patient is still dealing with significant left shoulder pain.  I want to see him back today for repeat exam after trial of anti-inflammatories combined with therapy on the shoulder with exercises and stretching.  I also provided an injection in the left shoulder subacromial space with a steroid.  He comes in today stating that the steroid did relieve some of the pain but he still concerned about the weakness and decreased mobility and motion of his left shoulder.  On exam, the left shoulder has significant weakness when stressing against abduction.  He is also using more of his deltoids to abduct her shoulder and he cannot reach overhead.  There is certainly weakness in the shoulder on exam with external rotation as well.  He has significant pain with crossarm test.  His liftoff is also significantly weak.  At this point given the profound weakness of his left shoulder combined with the pain he is having and failed conservative treatment, I am recommending a MRI of the left shoulder to rule out a rotator cuff tear.  He agrees with this assessment and plan.  I will see him back when we have the MRI of the left shoulder hopefully in less than 2 weeks.

## 2019-09-27 ENCOUNTER — Telehealth: Payer: Self-pay | Admitting: Orthopaedic Surgery

## 2019-09-27 NOTE — Telephone Encounter (Signed)
Patient called wanting to know if his insurance will allow him to go to another facility that can get him in sooner than May 17th. The reason being is that he is on his wife's insurance and she is getting ready to retire.  CB#[phone].  Thank you.

## 2019-09-27 NOTE — Telephone Encounter (Signed)
LM on voicemail for the patient to call the office to schedule an MRI Review with Dr. Magnus Ivan at May 17th.

## 2019-09-27 NOTE — Telephone Encounter (Signed)
Order has been faxed to [facility] and refaxed today back to [facility], someone will contact pt to schedule apt

## 2019-09-27 NOTE — Telephone Encounter (Signed)
Called Sandy back and left vm stating order has been refaxed

## 2019-09-28 ENCOUNTER — Ambulatory Visit: Payer: BC Managed Care – PPO | Admitting: Orthopaedic Surgery

## 2019-10-11 ENCOUNTER — Ambulatory Visit: Payer: BC Managed Care – PPO | Admitting: Orthopaedic Surgery

## 2019-10-11 ENCOUNTER — Other Ambulatory Visit: Payer: BC Managed Care – PPO

## 2020-09-20 ENCOUNTER — Other Ambulatory Visit: Payer: Self-pay | Admitting: Orthopaedic Surgery

## 2020-11-08 ENCOUNTER — Other Ambulatory Visit: Payer: Self-pay | Admitting: Orthopaedic Surgery

## 2020-11-08 DIAGNOSIS — Z96612 Presence of left artificial shoulder joint: Secondary | ICD-10-CM

## 2023-09-25 ENCOUNTER — Ambulatory Visit: Payer: Self-pay | Admitting: Physician Assistant

## 2023-09-29 ENCOUNTER — Ambulatory Visit: Payer: Self-pay | Admitting: Physician Assistant

## 2023-10-15 ENCOUNTER — Ambulatory Visit: Payer: Self-pay | Admitting: Orthopaedic Surgery

## 2023-11-10 ENCOUNTER — Ambulatory Visit (INDEPENDENT_AMBULATORY_CARE_PROVIDER_SITE_OTHER): Payer: Self-pay | Admitting: Orthopaedic Surgery

## 2023-11-10 ENCOUNTER — Other Ambulatory Visit (INDEPENDENT_AMBULATORY_CARE_PROVIDER_SITE_OTHER): Payer: Self-pay

## 2023-11-10 DIAGNOSIS — M5442 Lumbago with sciatica, left side: Secondary | ICD-10-CM | POA: Diagnosis not present

## 2023-11-10 DIAGNOSIS — M25512 Pain in left shoulder: Secondary | ICD-10-CM | POA: Diagnosis not present

## 2023-11-10 DIAGNOSIS — G8929 Other chronic pain: Secondary | ICD-10-CM | POA: Diagnosis not present

## 2023-11-10 DIAGNOSIS — M542 Cervicalgia: Secondary | ICD-10-CM

## 2023-11-10 DIAGNOSIS — M7542 Impingement syndrome of left shoulder: Secondary | ICD-10-CM | POA: Diagnosis not present

## 2023-11-10 DIAGNOSIS — M5441 Lumbago with sciatica, right side: Secondary | ICD-10-CM | POA: Diagnosis not present

## 2023-11-10 DIAGNOSIS — M25521 Pain in right elbow: Secondary | ICD-10-CM

## 2023-11-10 MED ORDER — LIDOCAINE HCL 1 % IJ SOLN
1.0000 mL | INTRAMUSCULAR | Status: AC | PRN
  Administered 2023-11-10: 1 mL

## 2023-11-10 MED ORDER — LIDOCAINE HCL 1 % IJ SOLN
3.0000 mL | INTRAMUSCULAR | Status: AC | PRN
  Administered 2023-11-10: 3 mL

## 2023-11-10 MED ORDER — TIZANIDINE HCL 4 MG PO TABS: 4.0000 mg | ORAL_TABLET | Freq: Three times a day (TID) | ORAL | 1 refills | Status: AC | PRN

## 2023-11-10 MED ORDER — METHYLPREDNISOLONE ACETATE 40 MG/ML IJ SUSP: 40.0000 mg | INTRAMUSCULAR | Status: AC | PRN

## 2023-11-10 MED ORDER — METHYLPREDNISOLONE ACETATE 40 MG/ML IJ SUSP
40.0000 mg | INTRAMUSCULAR | Status: AC | PRN
  Administered 2023-11-10: 40 mg via INTRA_ARTICULAR

## 2023-11-10 NOTE — Progress Notes (Signed)
 The patient is someone we have seen in the past but has been in quite some time.  He comes in today with neck pain, left shoulder pain, low back pain, and right elbow pain.  He does have known osteoarthritis of the right elbow.  He had arthroscopic surgery by Dr. Julio Ohm many years ago on the right elbow.  Steroid injections have helped his elbow in the past.  He has also been dealing with left-sided neck pain and left shoulder pain.  We have seen him for his right shoulder before.  We have also seen him for his lumbar spine.  He does have back pain that wakes him up at night and does radiate down the back of both of his legs.  He is not a diabetic.  Examination of his neck shows full range of motion of his neck with just some stiffness with lateral rotation and lateral bending but no radicular problems in the upper extremities.  He does have positive Neer and Hawkins signs of the left shoulder with good range of motion but is stiff past 90 degrees of forward flexion and abduction.  He has stiffness with flexion extension of the lumbar spine.  There is a positive straight leg raise bilaterally.  2 views of the cervical spine shows no acute findings with just some mild arthritic changes.  2 views of the lumbar spine shows loss of lumbar lordosis with significant arthritic changes lower lumbar spine between L4 4 and L5 as well as L5 and S1.  This is both posterior and anterior.  I did place a sterile injection in his left shoulder subacromial outlet in his right elbow joint without difficulty.  We will send him for a MRI of his lumbar spine to rule out nerve compression given his continued symptoms of stenosis and sciatica.  Will see him back in follow-up after the MRI.  I will send in a muscle relaxant for him.    Procedure Note  Patient: Keith Chen             Date of Birth: 08/03/1960           MRN: [id number]             Visit Date: 11/10/2023  Procedures: Visit Diagnoses:  1. Chronic bilateral  low back pain with bilateral sciatica   2. Cervicalgia   3. Chronic left shoulder pain   4. Impingement syndrome of left shoulder   5. Pain in right elbow     Large Joint Inj: L subacromial bursa on 11/10/2023 10:06 AM Indications: pain and diagnostic evaluation Details: 22 G 1.5 in needle  Arthrogram: No  Medications: 3 mL lidocaine  1 %; 40 mg methylPREDNISolone  acetate 40 MG/ML Outcome: tolerated well, no immediate complications Procedure, treatment alternatives, risks and benefits explained, specific risks discussed. Consent was given by the patient. Immediately prior to procedure a time out was called to verify the correct patient, procedure, equipment, support staff and site/side marked as required. Patient was prepped and draped in the usual sterile fashion.    Medium Joint Inj: R elbow on 11/10/2023 10:06 AM Medications: 1 mL lidocaine  1 %; 40 mg methylPREDNISolone  acetate 40 MG/ML

## 2024-06-16 ENCOUNTER — Other Ambulatory Visit: Payer: Self-pay | Admitting: Orthopaedic Surgery

## 2024-06-16 ENCOUNTER — Other Ambulatory Visit: Payer: Self-pay | Admitting: Radiology

## 2024-06-16 ENCOUNTER — Encounter: Payer: Self-pay | Admitting: Orthopaedic Surgery

## 2024-06-16 DIAGNOSIS — G8929 Other chronic pain: Secondary | ICD-10-CM

## 2024-06-17 ENCOUNTER — Telehealth: Payer: Self-pay | Admitting: Orthopaedic Surgery

## 2024-06-17 NOTE — Telephone Encounter (Signed)
 Pt's wife called wanting the insurance code for her husbands MRI tomorrow. Call back number is [phone]

## 2024-06-17 NOTE — Telephone Encounter (Signed)
 Order changed to [facility]

## 2024-06-17 NOTE — Telephone Encounter (Signed)
 Pt's wife called asking if they can switch MRI facility for saving cost. Please send referral to [facility]. Pt's wife Particia number is [phone].

## 2024-06-17 NOTE — Telephone Encounter (Signed)
 Contacted pt and CPT and ICD codes were given

## 2024-06-18 ENCOUNTER — Other Ambulatory Visit

## 2024-06-23 ENCOUNTER — Telehealth: Payer: Self-pay | Admitting: Orthopaedic Surgery

## 2024-06-23 NOTE — Telephone Encounter (Signed)
 Referral faxed to [facility] at [phone]

## 2024-06-23 NOTE — Telephone Encounter (Signed)
 Pt's wife called asking for Samule to resend MRI referral to [facility]. Pt stated they said they did not receive it.
